# Patient Record
Sex: Male | Born: 1980
Health system: Southern US, Community
[De-identification: ages and names within clinical notes are randomized; demographics above are authoritative.]

## PROBLEM LIST (undated history)

## (undated) DIAGNOSIS — K219 Gastro-esophageal reflux disease without esophagitis: Secondary | ICD-10-CM

## (undated) DIAGNOSIS — G43909 Migraine, unspecified, not intractable, without status migrainosus: Secondary | ICD-10-CM

## (undated) HISTORY — DX: Gastro-esophageal reflux disease without esophagitis: K21.9

## (undated) HISTORY — PX: OTHER SURGICAL HISTORY: SHX169

## (undated) HISTORY — DX: Migraine, unspecified, not intractable, without status migrainosus: G43.909

---

## 2003-12-13 ENCOUNTER — Emergency Department (HOSPITAL_COMMUNITY): Admission: EM | Admit: 2003-12-13 | Discharge: 2003-12-13 | Payer: Self-pay | Admitting: Emergency Medicine

## 2003-12-15 ENCOUNTER — Ambulatory Visit (HOSPITAL_COMMUNITY): Admission: RE | Admit: 2003-12-15 | Discharge: 2003-12-15 | Payer: Self-pay | Admitting: Internal Medicine

## 2003-12-18 ENCOUNTER — Inpatient Hospital Stay (HOSPITAL_COMMUNITY): Admission: EM | Admit: 2003-12-18 | Discharge: 2003-12-20 | Payer: Self-pay | Admitting: Emergency Medicine

## 2005-09-27 IMAGING — CR DG CHEST 2V
2 series · 2 of 2 positions shown · non-contrast
Comparison: none

CLINICAL DATA: Headache, fever, vomiting.
 TWO-VIEW CHEST   RADIOGRAPH, 12/18/03
 No prior studies.

[view not recorded (1 of 2)]
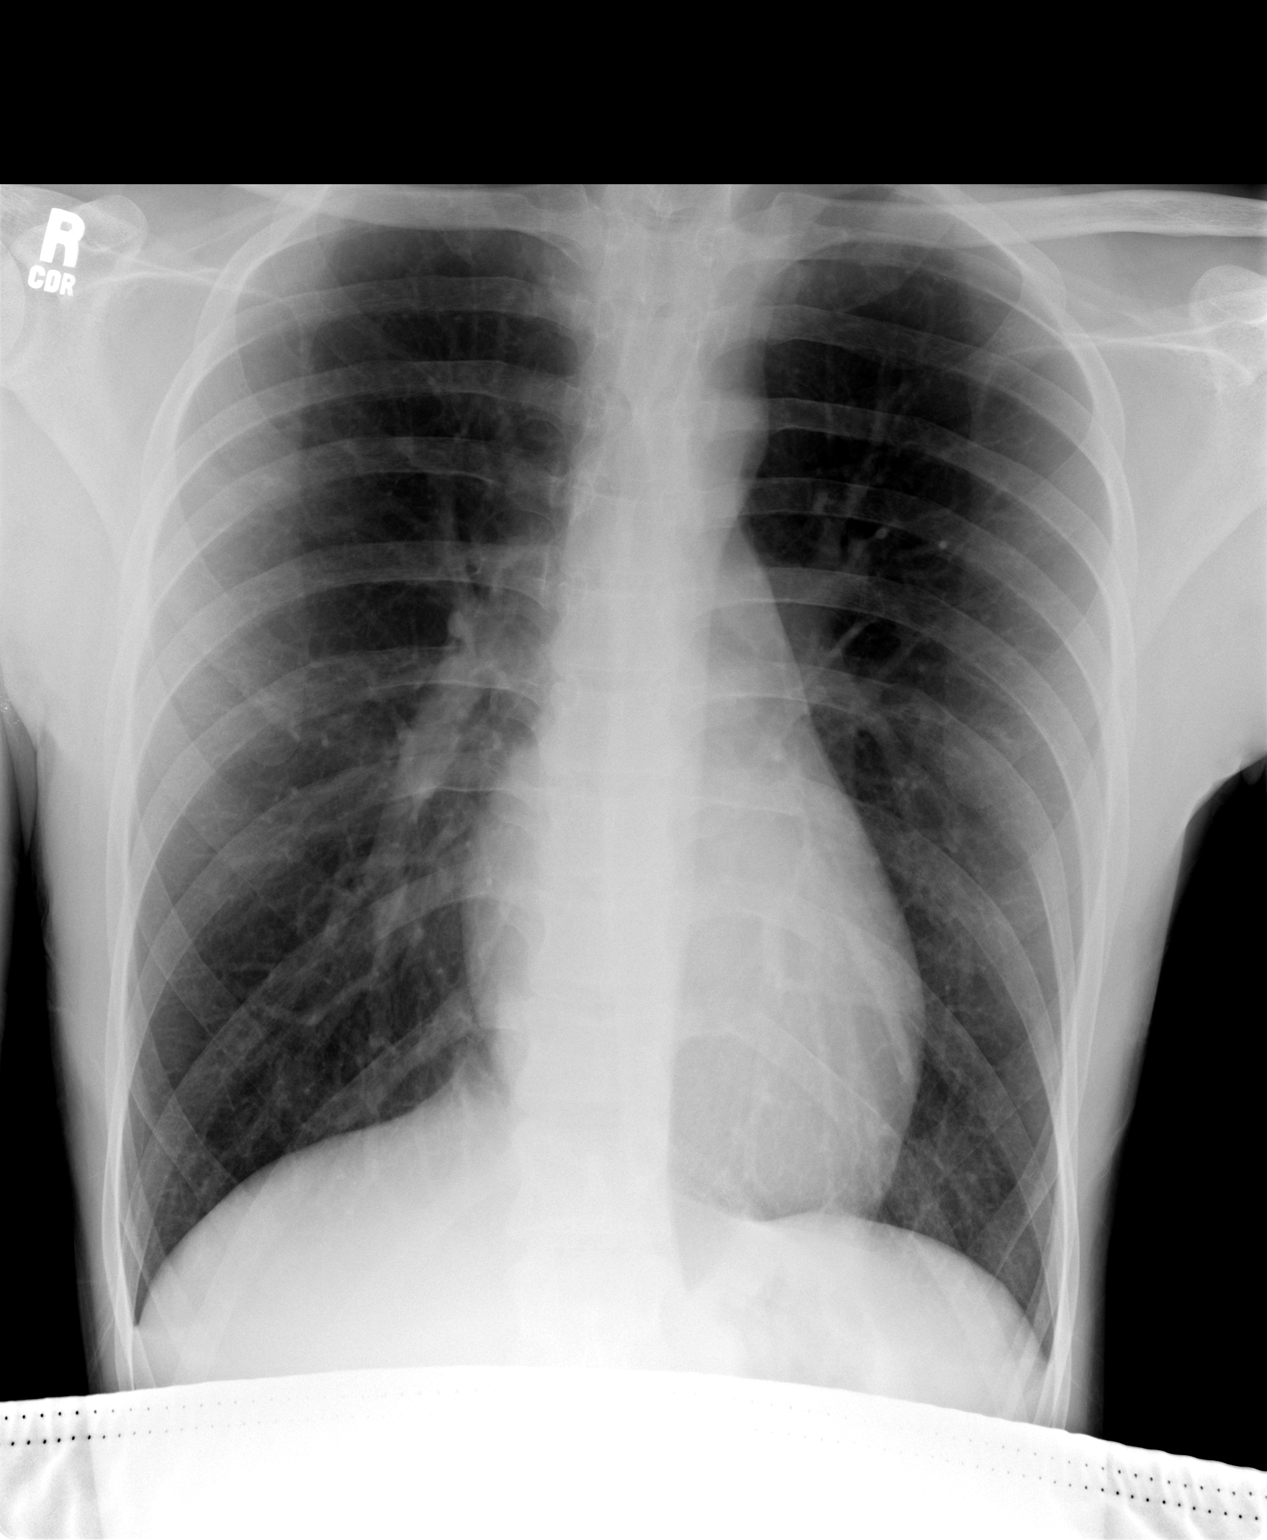

[view not recorded (2 of 2)]
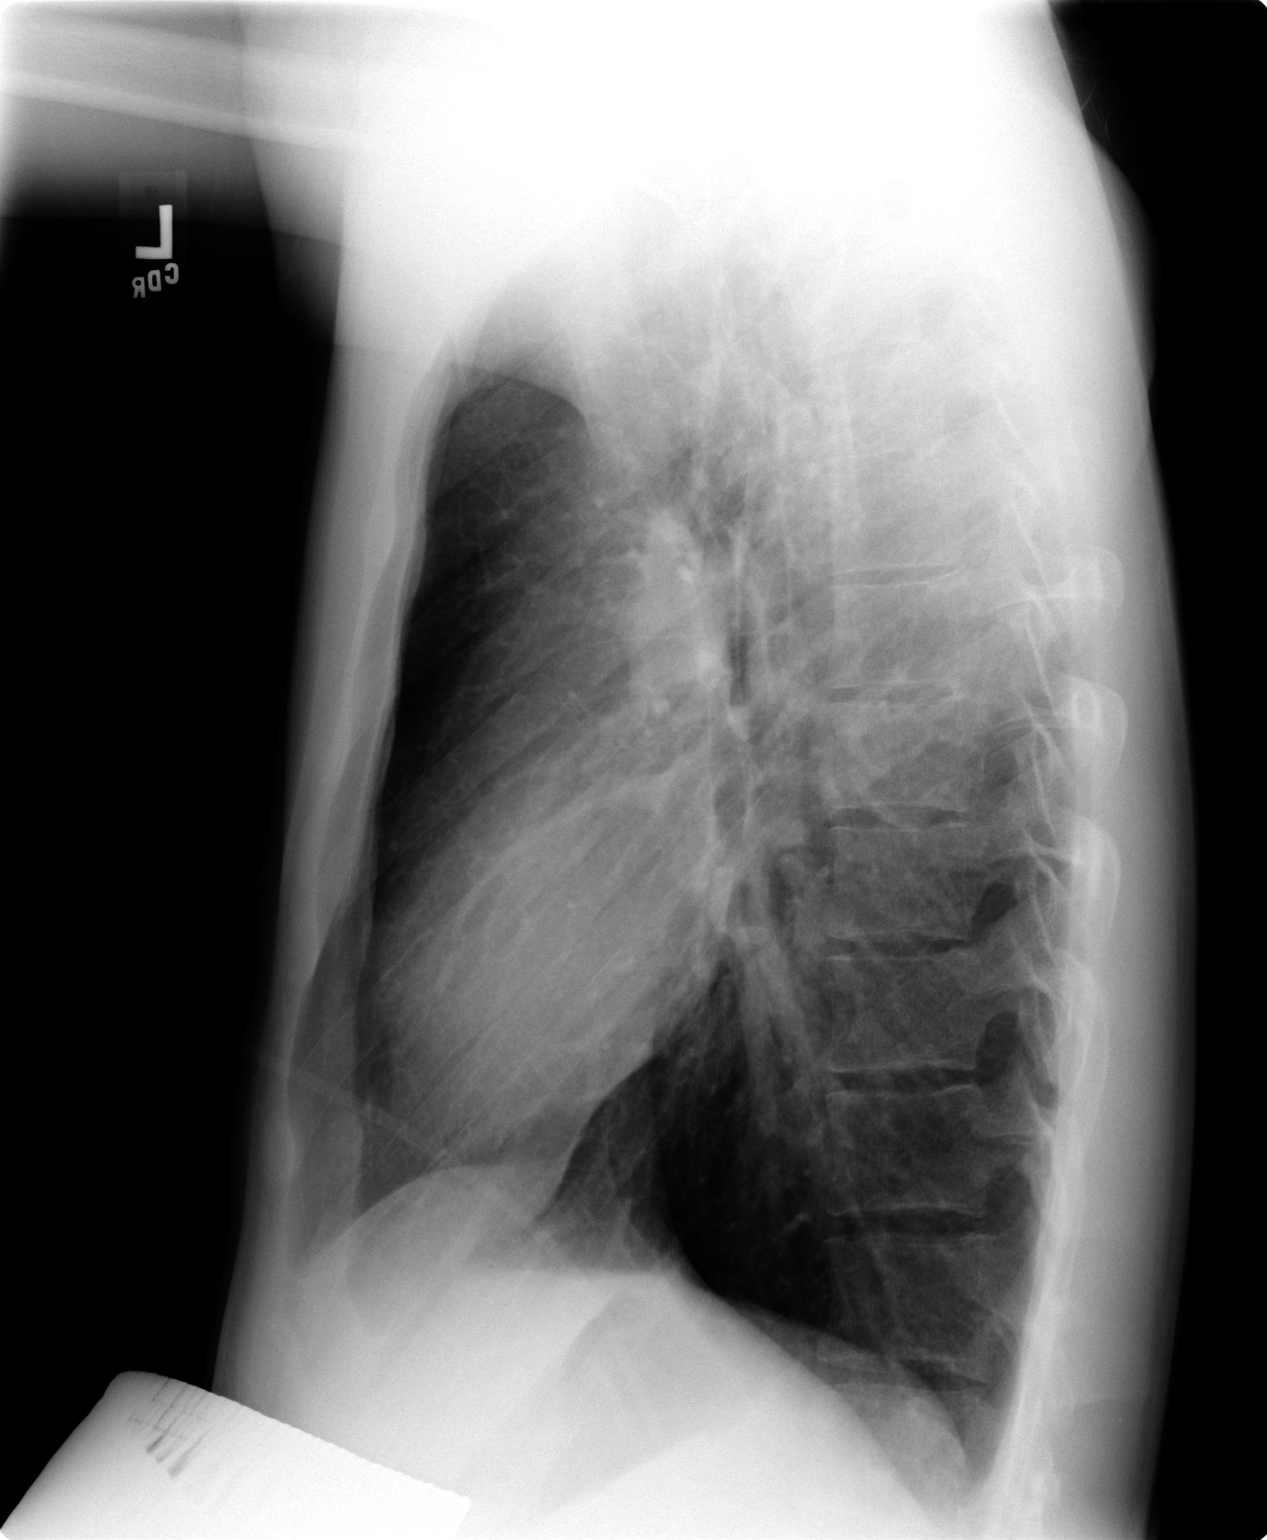

[2 of 2 positions shown; findings below may reference images not displayed]

FINDINGS: The heart size and mediastinal contours are unremarkable.  The lungs are clear.  The visualized skeleton is unremarkable.
 IMPRESSION
 No active disease.

## 2018-08-28 MED FILL — SUMATRIPTAN SUCC 100 MG TAB: 100 | 30 days supply | Qty: 9 | Fill #0

## 2018-08-29 ENCOUNTER — Other Ambulatory Visit: Payer: Self-pay | Admitting: Internal Medicine

## 2018-08-29 DIAGNOSIS — G4489 Other headache syndrome: Secondary | ICD-10-CM

## 2018-10-19 ENCOUNTER — Ambulatory Visit
Admission: RE | Admit: 2018-10-19 | Discharge: 2018-10-19 | Disposition: A | Discharge status: Home / Self Care | Payer: No Typology Code available for payment source | Source: Ambulatory Visit | Attending: Internal Medicine | Admitting: Internal Medicine

## 2018-10-19 DIAGNOSIS — G4489 Other headache syndrome: Secondary | ICD-10-CM

## 2018-10-19 MED ORDER — GADOBENATE DIMEGLUMINE 529 MG/ML IV SOLN
15.0000 mL | Freq: Once | INTRAVENOUS | Status: AC | PRN
  Administered 2018-10-19: 15 mL via INTRAVENOUS

## 2019-02-28 ENCOUNTER — Telehealth: Payer: No Typology Code available for payment source | Admitting: Physician Assistant

## 2019-02-28 DIAGNOSIS — B9689 Other specified bacterial agents as the cause of diseases classified elsewhere: Secondary | ICD-10-CM

## 2019-02-28 DIAGNOSIS — J019 Acute sinusitis, unspecified: Secondary | ICD-10-CM

## 2019-02-28 MED ORDER — AMOXICILLIN-POT CLAVULANATE 875-125 MG PO TABS: 1.0000 | ORAL_TABLET | Freq: Two times a day (BID) | ORAL | 0 refills | Status: DC

## 2019-02-28 MED FILL — AMOX-CLAV 875-125 MG TABLET: 875-125 | 7 days supply | Qty: 14 | Fill #0

## 2019-02-28 NOTE — Progress Notes (Signed)

## 2019-02-28 NOTE — Progress Notes (Signed)
I have spent 5 minutes in review of e-visit questionnaire, review and updating patient chart, medical decision making and response to patient.   Quincie Haroon Cody Malijah Lietz, PA-C    

## 2019-07-12 ENCOUNTER — Ambulatory Visit: Payer: No Typology Code available for payment source | Attending: Internal Medicine

## 2019-07-12 DIAGNOSIS — Z23 Encounter for immunization: Secondary | ICD-10-CM

## 2019-07-12 NOTE — Progress Notes (Signed)
   Covid-19 Vaccination Clinic  Name:  Nicholas Carey    MRN: [id number] DOB: 1980/07/29  07/12/2019  [name] was observed post Covid-19 immunization for 15 minutes without incident. He was provided with Vaccine Information Sheet and instruction to access the V-Safe system.   [name] was instructed to call 911 with any severe reactions post vaccine: Marland Kitchen Difficulty breathing  . Swelling of face and throat  . A fast heartbeat  . A bad rash all over body  . Dizziness and weakness   Immunizations Administered    Name Date Dose VIS Date Route   Pfizer COVID-19 Vaccine 07/12/2019  1:56 PM 0.3 mL 04/12/2019 Intramuscular   Manufacturer: [employer], [employer]   Lot: [id number]   NDC: [address]-2

## 2019-08-05 ENCOUNTER — Ambulatory Visit: Payer: No Typology Code available for payment source | Attending: Internal Medicine

## 2019-08-05 DIAGNOSIS — Z23 Encounter for immunization: Secondary | ICD-10-CM

## 2019-08-05 NOTE — Progress Notes (Signed)
   Covid-19 Vaccination Clinic  Name:  Nicholas Carey    MRN: [id number] DOB: 24-Mar-1981  08/05/2019  [name] was observed post Covid-19 immunization for 15 minutes without incident. He was provided with Vaccine Information Sheet and instruction to access the V-Safe system.   [name] was instructed to call 911 with any severe reactions post vaccine: Marland Kitchen Difficulty breathing  . Swelling of face and throat  . A fast heartbeat  . A bad rash all over body  . Dizziness and weakness   Immunizations Administered    Name Date Dose VIS Date Route   Pfizer COVID-19 Vaccine 08/05/2019  3:39 PM 0.3 mL 04/12/2019 Intramuscular   Manufacturer: [employer], [employer]   Lot: [id number]   NDC: [address]-4

## 2020-04-02 ENCOUNTER — Telehealth: Payer: No Typology Code available for payment source | Admitting: Family

## 2020-04-02 ENCOUNTER — Other Ambulatory Visit: Payer: Self-pay | Admitting: Family

## 2020-04-02 DIAGNOSIS — B9689 Other specified bacterial agents as the cause of diseases classified elsewhere: Secondary | ICD-10-CM | POA: Diagnosis not present

## 2020-04-02 DIAGNOSIS — J019 Acute sinusitis, unspecified: Secondary | ICD-10-CM

## 2020-04-02 MED ORDER — AMOXICILLIN-POT CLAVULANATE 875-125 MG PO TABS: 1.0000 | ORAL_TABLET | Freq: Two times a day (BID) | ORAL | 0 refills | Status: DC

## 2020-04-02 MED FILL — AMOX-CLAV 875-125 MG TABLET: 875-125 | 10 days supply | Qty: 20 | Fill #0

## 2020-04-02 NOTE — Progress Notes (Signed)
We are sorry that you are not feeling well.  Here is how we plan to help!  Based on what you have shared with me it looks like you have sinusitis.  Sinusitis is inflammation and infection in the sinus cavities of the head.  Based on your presentation I believe you most likely have Acute Bacterial Sinusitis.  This is an infection caused by bacteria and is treated with antibiotics. I have prescribed Augmentin 875mg/125mg one tablet twice daily with food, for 10 days. You may use an oral decongestant such as Mucinex D or if you have glaucoma or high blood pressure use plain Mucinex. Saline nasal spray help and can safely be used as often as needed for congestion.  If you develop worsening sinus pain, fever or notice severe headache and vision changes, or if symptoms are not better after completion of antibiotic, please schedule an appointment with a health care provider.    Sinus infections are not as easily transmitted as other respiratory infection, however we still recommend that you avoid close contact with loved ones, especially the very young and elderly.  Remember to wash your hands thoroughly throughout the day as this is the number one way to prevent the spread of infection!  Home Care:  Only take medications as instructed by your medical team.  Complete the entire course of an antibiotic.  Do not take these medications with alcohol.  A steam or ultrasonic humidifier can help congestion.  You can place a towel over your head and breathe in the steam from hot water coming from a faucet.  Avoid close contacts especially the very young and the elderly.  Cover your mouth when you cough or sneeze.  Always remember to wash your hands.  Get Help Right Away If:  You develop worsening fever or sinus pain.  You develop a severe head ache or visual changes.  Your symptoms persist after you have completed your treatment plan.  Make sure you  Understand these instructions.  Will watch your  condition.  Will get help right away if you are not doing well or get worse.  Your e-visit answers were reviewed by a board certified advanced clinical practitioner to complete your personal care plan.  Depending on the condition, your plan could have included both over the counter or prescription medications.  If there is a problem please reply  once you have received a response from your provider.  Your safety is important to us.  If you have drug allergies check your prescription carefully.    You can use MyChart to ask questions about today's visit, request a non-urgent call back, or ask for a work or school excuse for 24 hours related to this e-Visit. If it has been greater than 24 hours you will need to follow up with your provider, or enter a new e-Visit to address those concerns.  You will get an e-mail in the next two days asking about your experience.  I hope that your e-visit has been valuable and will speed your recovery. Thank you for using e-visits.  Greater than 5 minutes, yet less than 10 minutes of time have been spent researching, coordinating, and implementing care for this patient.     

## 2020-05-08 ENCOUNTER — Other Ambulatory Visit (HOSPITAL_COMMUNITY): Payer: Self-pay | Admitting: Internal Medicine

## 2020-05-08 MED FILL — CYCLOBENZAPRINE HCL 10 MG T: 10 | 30 days supply | Qty: 60 | Fill #0

## 2020-07-13 ENCOUNTER — Telehealth: Payer: No Typology Code available for payment source | Admitting: Physician Assistant

## 2020-07-13 ENCOUNTER — Other Ambulatory Visit: Payer: Self-pay | Admitting: Physician Assistant

## 2020-07-13 DIAGNOSIS — J028 Acute pharyngitis due to other specified organisms: Secondary | ICD-10-CM | POA: Diagnosis not present

## 2020-07-13 DIAGNOSIS — B9689 Other specified bacterial agents as the cause of diseases classified elsewhere: Secondary | ICD-10-CM

## 2020-07-13 MED ORDER — AMOXICILLIN 500 MG PO CAPS: 500.0000 mg | ORAL_CAPSULE | Freq: Two times a day (BID) | ORAL | 0 refills | Status: DC

## 2020-07-13 NOTE — Progress Notes (Signed)

## 2020-07-13 NOTE — Progress Notes (Signed)
I have spent 5 minutes in review of e-visit questionnaire, review and updating patient chart, medical decision making and response to patient.   Katalyn Matin Cody Zyeir Dymek, PA-C    

## 2020-07-29 IMAGING — MR MRI HEAD WITHOUT AND WITH CONTRAST
12 series · 48 of 48 positions shown · IV contrast (multihance)
Comparison: MRI head 12/18/2003

CLINICAL DATA: Headache 2 years.

EXAM:
MRI HEAD WITHOUT AND WITH CONTRAST
TECHNIQUE: Multiplanar, multiecho pulse sequences of the brain and surrounding
structures were obtained without and with intravenous contrast.
CONTRAST:  15mL MULTIHANCE GADOBENATE DIMEGLUMINE 529 MG/ML IV SOLN

[Series 5: T1 · sagittal · 4.0mm · 0.75mm/px · 3 of 30 slices shown (1 of 3)]
[im 1/30]
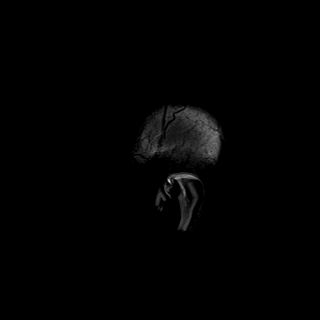
[im 15/30]
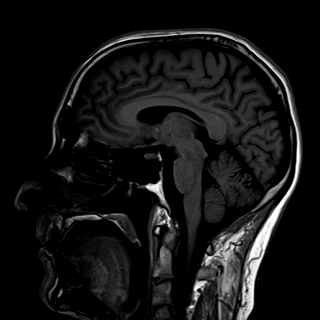
[im 30/30]
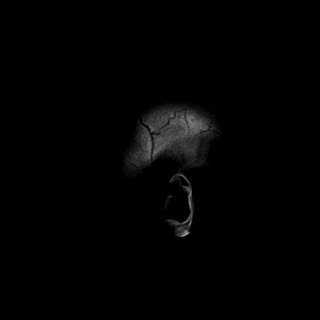

[Series 6: DWI · axial · 3.0mm · 1.44mm/px · z∈[-67,+77]mm · 5 of 86 slices shown (1 of 4)]
[im 1/86]
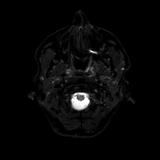
[im 22/86]
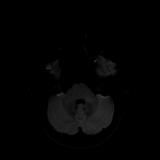
[im 43/86]
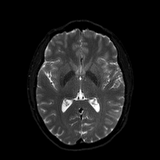
[im 64/86]
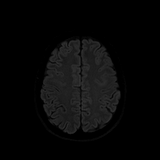
[im 86/86]
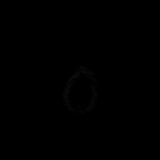

[Series 7: DWI · axial · 3.0mm · 1.44mm/px · z∈[-67,+77]mm · 2 of 42 slices shown (2 of 4)]
[im 1/42]
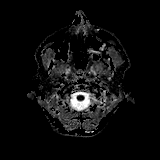
[im 42/42]
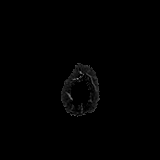

[Series 8: DWI · coronal · 5.0mm · 1.44mm/px · 4 of 64 slices shown (3 of 4)]
[im 1/64]
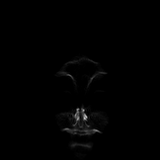
[im 22/64]
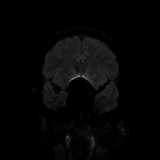
[im 43/64]
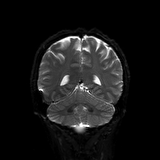
[im 64/64]
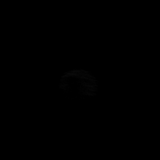

[Series 9: DWI · coronal · 5.0mm · 1.44mm/px · 2 of 32 slices shown (4 of 4)]
[im 1/32]
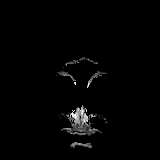
[im 32/32]
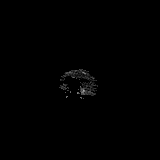

[Series 10: T2 · axial · 4.0mm · 0.36mm/px · z∈[-71,+85]mm · 2 of 31 slices shown]
[im 1/31]
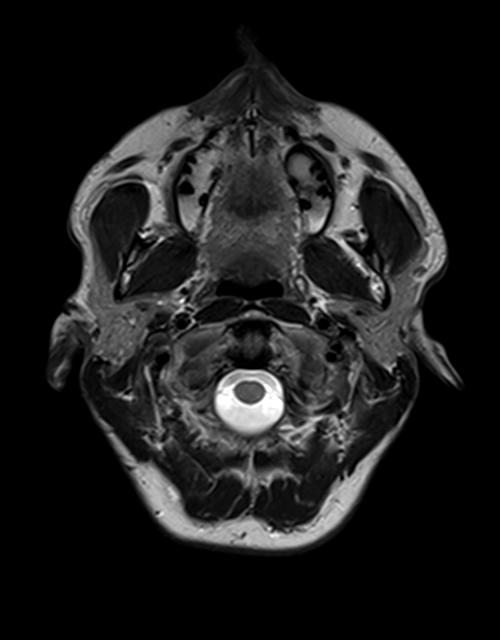
[im 31/31]
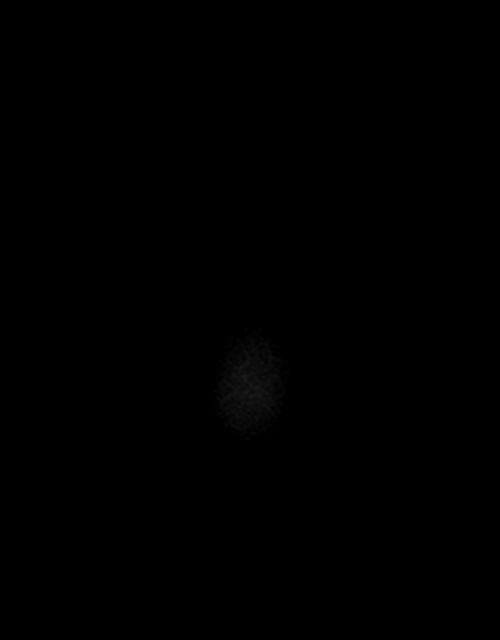

[Series 11: FLAIR · axial · 3.0mm · 0.72mm/px · z∈[-76,+86]mm · 2 of 28 slices shown]
[im 1/28]
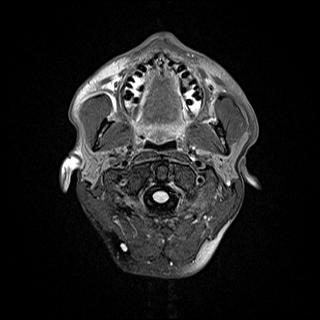
[im 28/28]
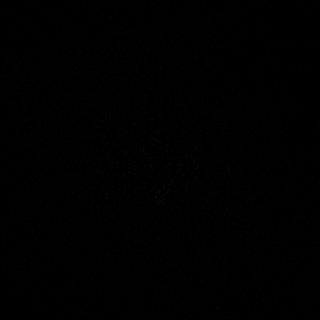

[Series 13: swi_images · axial · 1.5mm · 0.90mm/px · z∈[-70,+84]mm · 6 of 104 slices shown]
[im 1/104]
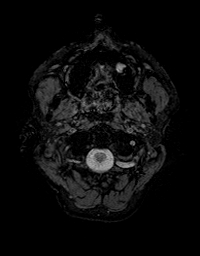
[im 21/104]
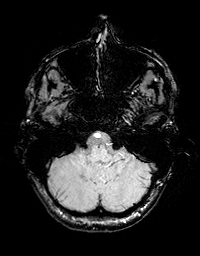
[im 42/104]
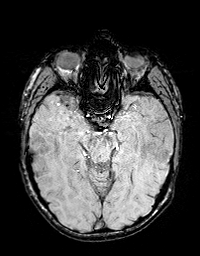
[im 62/104]
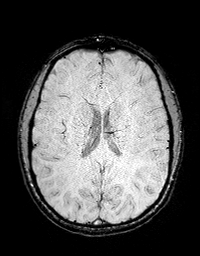
[im 83/104]
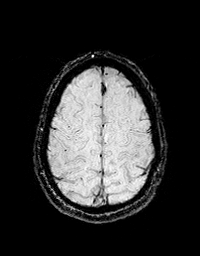
[im 104/104]
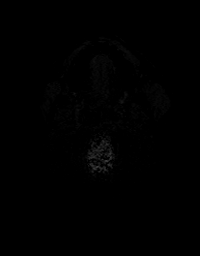

[Series 14: T1 · axial · 1.0mm · 0.90mm/px · z∈[-72,+87]mm · 9 of 160 slices shown (2 of 3)]
[im 1/160]
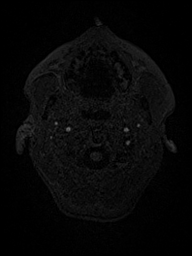
[im 20/160]
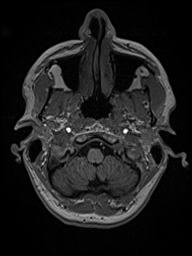
[im 40/160]
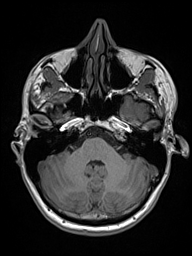
[im 60/160]
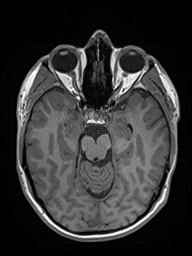
[im 80/160]
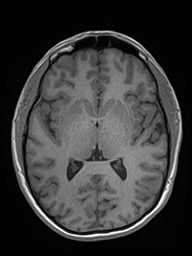
[im 100/160]
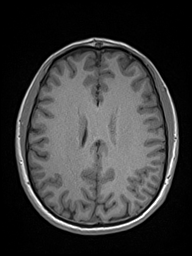
[im 120/160]
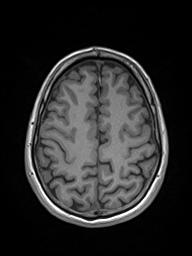
[im 140/160]
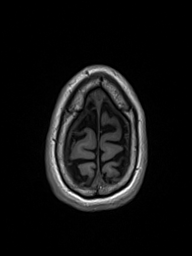
[im 160/160]
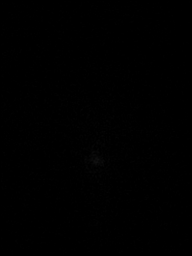

[Series 15: T2 post-contrast · coronal · 4.0mm · 0.36mm/px · 2 of 36 slices shown]
[im 1/36]
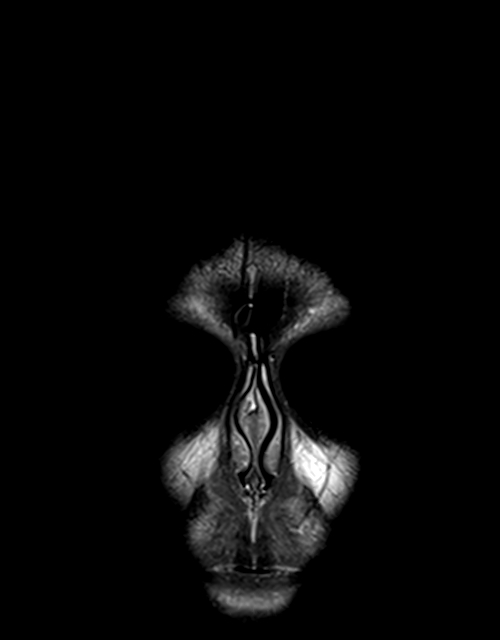
[im 36/36]
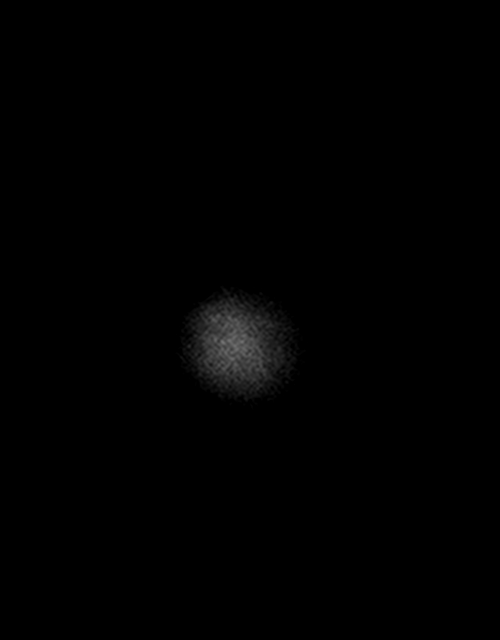

[Series 16: T1 · axial · 1.0mm · 0.90mm/px · z∈[-72,+87]mm · 9 of 160 slices shown (3 of 3)]
[im 1/160]
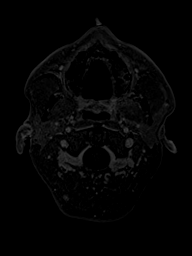
[im 20/160]
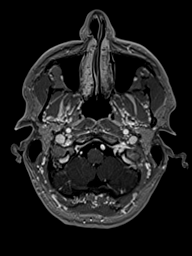
[im 40/160]
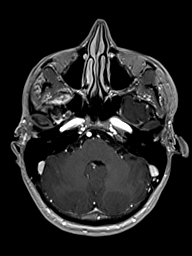
[im 60/160]
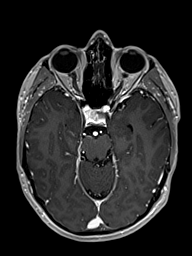
[im 80/160]
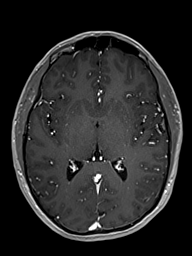
[im 100/160]
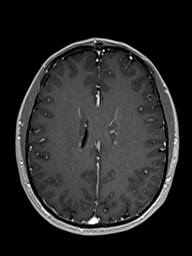
[im 120/160]
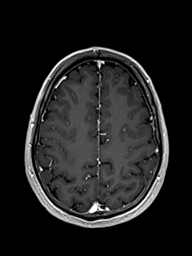
[im 140/160]
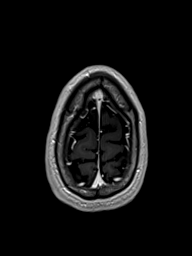
[im 160/160]
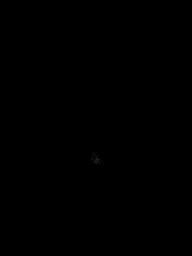

[Series 17: T1 post-contrast · coronal · 4.0mm · 0.72mm/px · 2 of 36 slices shown]
[im 1/36]
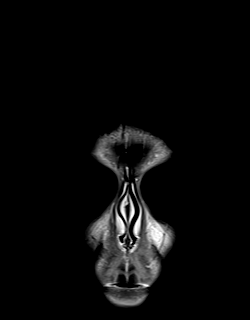
[im 36/36]
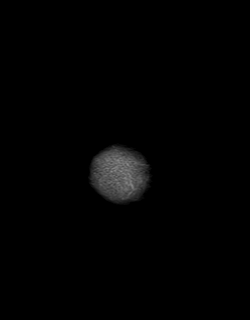

[48 of 48 positions shown; findings below may reference images not displayed]

FINDINGS: Brain: Ventricle size and cerebral volume normal. Negative for acute
or chronic infarct. Negative for demyelinating disease. Negative for
hemorrhage mass or fluid collection. Normal enhancement postcontrast
administration.

Vascular: Normal arterial flow voids

Skull and upper cervical spine: Negative

Sinuses/Orbits: Cyst left maxillary sinus. Remaining sinuses clear.
Normal orbit

Other: None
IMPRESSION: Normal MRI brain with contrast.

## 2020-12-25 ENCOUNTER — Other Ambulatory Visit (HOSPITAL_COMMUNITY): Payer: Self-pay

## 2020-12-25 ENCOUNTER — Telehealth: Payer: No Typology Code available for payment source | Admitting: Physician Assistant

## 2020-12-25 DIAGNOSIS — J019 Acute sinusitis, unspecified: Secondary | ICD-10-CM

## 2020-12-25 DIAGNOSIS — B9689 Other specified bacterial agents as the cause of diseases classified elsewhere: Secondary | ICD-10-CM | POA: Diagnosis not present

## 2020-12-25 MED ORDER — AMOXICILLIN-POT CLAVULANATE 875-125 MG PO TABS
1.0000 | ORAL_TABLET | Freq: Two times a day (BID) | ORAL | 0 refills | Status: DC
  Filled 2020-12-25: qty 20, 10d supply, fill #0

## 2020-12-25 NOTE — Progress Notes (Signed)

## 2021-08-23 ENCOUNTER — Telehealth: Payer: No Typology Code available for payment source | Admitting: Physician Assistant

## 2021-08-23 ENCOUNTER — Other Ambulatory Visit (HOSPITAL_COMMUNITY): Payer: Self-pay

## 2021-08-23 DIAGNOSIS — L247 Irritant contact dermatitis due to plants, except food: Secondary | ICD-10-CM

## 2021-08-23 MED ORDER — PREDNISONE 10 MG PO TABS
ORAL_TABLET | ORAL | 0 refills | Status: AC
  Filled 2021-08-23: qty 37, 14d supply, fill #0

## 2021-08-23 NOTE — Progress Notes (Signed)
E-Visit for Poison Ivy  We are sorry that you are not feeing well.  Here is how we plan to help!  Based on what you have shared with me it looks like you have had an allergic reaction to the oily resin from a group of plants.  This resin is very sticky, so it easily attaches to your skin, clothing, tools equipment, and pet's fur.    This blistering rash is often called poison ivy rash although it can come from contact with the leaves, stems and roots of poison ivy, poison oak and poison sumac.  The oily resin contains urushiol (u-ROO-she-ol) that produces a skin rash on exposed skin.  The severity of the rash depends on the amount of urushiol that gets on your skin.  A section of skin with more urushiol on it may develop a rash sooner.  The rash usually develops 12-48 hours after exposure and can last two to three weeks.  Your skin must come in direct contact with the plant's oil to be affected.  Blister fluid doesn't spread the rash.  However, if you come into contact with a piece of clothing or pet fur that has urushiol on it, the rash may spread out.  You can also transfer the oil to other parts of your body with your fingers.  Often the rash looks like a straight line because of the way the plant brushes against your skin.  Since your rash is widespread or has resulted in a large number of blisters, I have prescribed an oral corticosteroid.  Please follow these recommendations:  I have sent a prednisone dose pack to your chosen pharmacy. Be sure to follow the instructions carefully and complete the entire prescription. You may use Benadryl or Caladryl topical lotions to sooth the itch and remember cool, not hot, showers and baths can help relieve the itching!  Place cool, wet compresses on the affected area for 15-30 minutes several times a day.  You may also take oral antihistamines, such as diphenhydramine (Benadryl, others), which may also help you sleep better.  Watch your skin for any purulent  (pus) drainage or red streaking from the site.  If this occurs, contact your provider.  You may require an antibiotic for a skin infection.  Make sure that the clothes you were wearing as well as any towels or sheets that may have come in contact with the oil (urushiol) are washed in detergent and hot water.       I have developed the following plan to treat your condition I am prescribing a two week course of steroids (37 tablets of 10 mg prednisone).  Days 1-4 take 4 tablets (40 mg) daily  Days 5-8 take 3 tablets (30 mg) daily, Days 9-11 take 2 tablets (20 mg) daily, Days 12-14 take 1 tablet (10 mg) daily.    What can you do to prevent this rash?  Avoid the plants.  Learn how to identify poison ivy, poison oak and poison sumac in all seasons.  When hiking or engaging in other activities that might expose you to these plants, try to stay on cleared pathways.  If camping, make sure you pitch your tent in an area free of these plants.  Keep pets from running through wooded areas so that urushiol doesn't accidentally stick to their fur, which you may touch.  Remove or kill the plants.  In your yard, you can get rid of poison ivy by applying an herbicide or pulling it out of   the ground, including the roots, while wearing heavy gloves.  Afterward remove the gloves and thoroughly wash them and your hands.  Don't burn poison ivy or related plants because the urushiol can be carried by smoke.  Wear protective clothing.  If needed, protect your skin by wearing socks, boots, pants, long sleeves and vinyl gloves.  Wash your skin right away.  Washing off the oil with soap and water within 30 minutes of exposure may reduce your chances of getting a poison ivy rash.  Even washing after an hour or so can help reduce the severity of the rash.  If you walk through some poison ivy and then later touch your shoes, you may get some urushiol on your hands, which may then transfer to your face or body by touching or  rubbing.  If the contaminated object isn't cleaned, the urushiol on it can still cause a skin reaction years later.    Be careful not to reuse towels after you have washed your skin.  Also carefully wash clothing in detergent and hot water to remove all traces of the oil.  Handle contaminated clothing carefully so you don't transfer the urushiol to yourself, furniture, rugs or appliances.  Remember that pets can carry the oil on their fur and paws.  If you think your pet may be contaminated with urushiol, put on some long rubber gloves and give your pet a bath.  Finally, be careful not to burn these plants as the smoke can contain traces of the oil.  Inhaling the smoke may result in difficulty breathing. If that occurred you should see a physician as soon as possible.  See your doctor right away if:  The reaction is severe or widespread You inhaled the smoke from burning poison ivy and are having difficulty breathing Your skin continues to swell The rash affects your eyes, mouth or genitals Blisters are oozing pus You develop a fever greater than 100 F (37.8 C) The rash doesn't get better within a few weeks.  If you scratch the poison ivy rash, bacteria under your fingernails may cause the skin to become infected.  See your doctor if pus starts oozing from the blisters.  Treatment generally includes antibiotics.  Poison ivy treatments are usually limited to self-care methods.  And the rash typically goes away on its own in two to three weeks.     If the rash is widespread or results in a large number of blisters, your doctor may prescribe an oral corticosteroid, such as prednisone.  If a bacterial infection has developed at the rash site, your doctor may give you a prescription for an oral antibiotic.  MAKE SURE YOU  Understand these instructions. Will watch your condition. Will get help right away if you are not doing well or get worse.   Thank you for choosing an e-visit.  Your  e-visit answers were reviewed by a board certified advanced clinical practitioner to complete your personal care plan. Depending upon the condition, your plan could have included both over the counter or prescription medications.  Please review your pharmacy choice. Make sure the pharmacy is open so you can pick up prescription now. If there is a problem, you may contact your provider through MyChart messaging and have the prescription routed to another pharmacy.  Your safety is important to us. If you have drug allergies check your prescription carefully.   For the next 24 hours you can use MyChart to ask questions about today's visit, request a non-urgent   call back, or ask for a work or school excuse. You will get an email in the next two days asking about your experience. I hope that your e-visit has been valuable and will speed your recovery.   I provided 5 minutes of non face-to-face time during this encounter for chart review and documentation.   

## 2022-02-08 ENCOUNTER — Other Ambulatory Visit (HOSPITAL_COMMUNITY): Payer: Self-pay

## 2022-02-25 ENCOUNTER — Other Ambulatory Visit (HOSPITAL_BASED_OUTPATIENT_CLINIC_OR_DEPARTMENT_OTHER): Payer: Self-pay

## 2022-02-25 MED ORDER — COMIRNATY 30 MCG/0.3ML IM SUSY
PREFILLED_SYRINGE | INTRAMUSCULAR | 0 refills | Status: AC
  Filled 2022-02-25: qty 0.3, 1d supply, fill #0

## 2022-03-04 ENCOUNTER — Other Ambulatory Visit (HOSPITAL_COMMUNITY): Payer: Self-pay

## 2022-03-04 ENCOUNTER — Telehealth: Payer: No Typology Code available for payment source | Admitting: Emergency Medicine

## 2022-03-04 DIAGNOSIS — B9689 Other specified bacterial agents as the cause of diseases classified elsewhere: Secondary | ICD-10-CM | POA: Diagnosis not present

## 2022-03-04 DIAGNOSIS — J019 Acute sinusitis, unspecified: Secondary | ICD-10-CM

## 2022-03-04 MED ORDER — AMOXICILLIN-POT CLAVULANATE 875-125 MG PO TABS
1.0000 | ORAL_TABLET | Freq: Two times a day (BID) | ORAL | 0 refills | Status: DC
  Filled 2022-03-04: qty 14, 7d supply, fill #0

## 2022-03-04 NOTE — Progress Notes (Signed)
E-Visit for Sinus Problems  We are sorry that you are not feeling well.  Here is how we plan to help!  Based on what you have shared with me it looks like you have sinusitis.  Sinusitis is inflammation and infection in the sinus cavities of the head.  Based on your presentation I believe you most likely have Acute Bacterial Sinusitis.  This is an infection caused by bacteria and is treated with antibiotics. I have prescribed Augmentin 875mg/125mg one tablet twice daily with food, for 7 days.   You may use an oral decongestant such as Mucinex D or if you have glaucoma or high blood pressure use plain Mucinex.   Saline nasal spray help and can safely be used as often as needed for congestion.  Try using saline irrigation, such as with a neti pot, several times a day while you are sick. Many neti pots come with salt packets premeasured to use to make saline. If you use your own salt, make sure it is kosher salt or sea salt (don't use table salt as it has iodine in it and you don't need that in your nose). Use distilled water to make saline. If you mix your own saline using your own salt, the recipe is 1/4 teaspoon salt in 1 cup warm water. Using saline irrigation can help prevent and treat sinus infections.   If you develop worsening sinus pain, fever or notice severe headache and vision changes, or if symptoms are not better after completion of antibiotic, please schedule an appointment with a health care provider.    Sinus infections are not as easily transmitted as other respiratory infection, however we still recommend that you avoid close contact with loved ones, especially the very young and elderly.  Remember to wash your hands thoroughly throughout the day as this is the number one way to prevent the spread of infection!  Home Care: Only take medications as instructed by your medical team. Complete the entire course of an antibiotic. Do not take these medications with alcohol. A steam or  ultrasonic humidifier can help congestion.  You can place a towel over your head and breathe in the steam from hot water coming from a faucet. Avoid close contacts especially the very young and the elderly. Cover your mouth when you cough or sneeze. Always remember to wash your hands.  Get Help Right Away If: You develop worsening fever or sinus pain. You develop a severe head ache or visual changes. Your symptoms persist after you have completed your treatment plan.  Make sure you Understand these instructions. Will watch your condition. Will get help right away if you are not doing well or get worse.  Thank you for choosing an e-visit.  Your e-visit answers were reviewed by a board certified advanced clinical practitioner to complete your personal care plan. Depending upon the condition, your plan could have included both over the counter or prescription medications.  Please review your pharmacy choice. Make sure the pharmacy is open so you can pick up prescription now. If there is a problem, you may contact your provider through MyChart messaging and have the prescription routed to another pharmacy.  Your safety is important to us. If you have drug allergies check your prescription carefully.   For the next 24 hours you can use MyChart to ask questions about today's visit, request a non-urgent call back, or ask for a work or school excuse. You will get an email in the next two days asking   about your experience. I hope that your e-visit has been valuable and will speed your recovery.  I have spent 5 minutes in review of e-visit questionnaire, review and updating patient chart, medical decision making and response to patient.   Gerturde Kuba, PhD, FNP-BC   

## 2022-10-19 DIAGNOSIS — K219 Gastro-esophageal reflux disease without esophagitis: Secondary | ICD-10-CM | POA: Diagnosis not present

## 2022-10-19 DIAGNOSIS — Z125 Encounter for screening for malignant neoplasm of prostate: Secondary | ICD-10-CM | POA: Diagnosis not present

## 2022-10-19 DIAGNOSIS — R7301 Impaired fasting glucose: Secondary | ICD-10-CM | POA: Diagnosis not present

## 2022-10-26 DIAGNOSIS — Z0000 Encounter for general adult medical examination without abnormal findings: Secondary | ICD-10-CM | POA: Diagnosis not present

## 2022-11-22 ENCOUNTER — Other Ambulatory Visit (HOSPITAL_COMMUNITY): Payer: Self-pay

## 2022-11-22 ENCOUNTER — Telehealth: Payer: 59 | Admitting: Physician Assistant

## 2022-11-22 DIAGNOSIS — R5383 Other fatigue: Secondary | ICD-10-CM | POA: Diagnosis not present

## 2022-11-22 DIAGNOSIS — R058 Other specified cough: Secondary | ICD-10-CM | POA: Diagnosis not present

## 2022-11-22 DIAGNOSIS — R0981 Nasal congestion: Secondary | ICD-10-CM | POA: Diagnosis not present

## 2022-11-22 DIAGNOSIS — J209 Acute bronchitis, unspecified: Secondary | ICD-10-CM | POA: Diagnosis not present

## 2022-11-22 DIAGNOSIS — Z1152 Encounter for screening for COVID-19: Secondary | ICD-10-CM | POA: Diagnosis not present

## 2022-11-22 DIAGNOSIS — R0789 Other chest pain: Secondary | ICD-10-CM

## 2022-11-22 MED ORDER — AZITHROMYCIN 250 MG PO TABS
ORAL_TABLET | ORAL | 0 refills | Status: AC
  Filled 2022-11-22: qty 6, 5d supply, fill #0

## 2022-11-22 NOTE — Progress Notes (Signed)
Because of mention of shortness of breath and constant pain in chest or abdomen, I feel your condition warrants further evaluation and I recommend that you be seen in a face to face visit.   NOTE: There will be NO CHARGE for this eVisit   If you are having a true medical emergency please call 911.      For an urgent face to face visit, [facility] has eight urgent care centers for your convenience:   NEW!! [facility] at [facility] Get Driving Directions [phone] [address], [address] [place], [address]    [facility] at [facility] Get Driving Directions [phone] [address] [place], [place] [address]   [facility] Urgent Care Center [facility]) Get Driving Directions [phone] [address] [place], [place] [address]  [facility] Urgent Care Center [facility]) Get Driving Directions [phone] [address] [place],  [place]  [address]  [facility] Center [facility] - at [employer]  [phone] [address] [place],  [place] [address]   [facility] Health Urgent Care at [facility] Get Driving Directions [phone] [address] [place], [place] [address]   [facility] Health Urgent Care at [facility] [place] [facility] Get Driving Directions  [phone] [address] [place], [place] [address]   [facility] Health Urgent Care at [facility] Directions [phone] [address], [address] [place], [place] [address]  Your MyChart E-visit questionnaire answers were reviewed by a board certified advanced clinical practitioner to complete your personal care plan based on your specific symptoms.  Thank you for using e-Visits.

## 2022-12-02 ENCOUNTER — Other Ambulatory Visit (HOSPITAL_COMMUNITY): Payer: Self-pay

## 2023-02-11 DIAGNOSIS — Z23 Encounter for immunization: Secondary | ICD-10-CM | POA: Diagnosis not present

## 2023-03-03 ENCOUNTER — Other Ambulatory Visit (HOSPITAL_BASED_OUTPATIENT_CLINIC_OR_DEPARTMENT_OTHER): Payer: Self-pay

## 2023-03-03 MED ORDER — COVID-19 MRNA VAC-TRIS(PFIZER) 30 MCG/0.3ML IM SUSY
0.3000 mL | PREFILLED_SYRINGE | Freq: Once | INTRAMUSCULAR | 0 refills | Status: AC
  Filled 2023-03-03: qty 0.3, 1d supply, fill #0

## 2023-03-08 ENCOUNTER — Telehealth: Payer: 59 | Admitting: Physician Assistant

## 2023-03-08 ENCOUNTER — Other Ambulatory Visit (HOSPITAL_COMMUNITY): Payer: Self-pay

## 2023-03-08 DIAGNOSIS — M545 Low back pain, unspecified: Secondary | ICD-10-CM

## 2023-03-08 MED ORDER — NAPROXEN 500 MG PO TABS
500.0000 mg | ORAL_TABLET | Freq: Two times a day (BID) | ORAL | 0 refills | Status: AC
  Filled 2023-03-08: qty 15, 8d supply, fill #0

## 2023-03-08 MED ORDER — CYCLOBENZAPRINE HCL 10 MG PO TABS
10.0000 mg | ORAL_TABLET | Freq: Three times a day (TID) | ORAL | 0 refills | Status: AC | PRN
  Filled 2023-03-08: qty 15, 5d supply, fill #0

## 2023-03-08 NOTE — Progress Notes (Signed)
I have spent 5 minutes in review of e-visit questionnaire, review and updating patient chart, medical decision making and response to patient.   Mia Milan Cody Jacklynn Dehaas, PA-C    

## 2023-03-08 NOTE — Progress Notes (Signed)

## 2023-11-08 DIAGNOSIS — Z125 Encounter for screening for malignant neoplasm of prostate: Secondary | ICD-10-CM | POA: Diagnosis not present

## 2023-11-08 DIAGNOSIS — K219 Gastro-esophageal reflux disease without esophagitis: Secondary | ICD-10-CM | POA: Diagnosis not present

## 2023-11-08 DIAGNOSIS — R7301 Impaired fasting glucose: Secondary | ICD-10-CM | POA: Diagnosis not present

## 2023-11-16 DIAGNOSIS — Z0000 Encounter for general adult medical examination without abnormal findings: Secondary | ICD-10-CM | POA: Diagnosis not present

## 2023-11-16 DIAGNOSIS — Z1339 Encounter for screening examination for other mental health and behavioral disorders: Secondary | ICD-10-CM | POA: Diagnosis not present

## 2023-11-16 DIAGNOSIS — G43909 Migraine, unspecified, not intractable, without status migrainosus: Secondary | ICD-10-CM | POA: Diagnosis not present

## 2023-11-16 DIAGNOSIS — R82998 Other abnormal findings in urine: Secondary | ICD-10-CM | POA: Diagnosis not present

## 2023-11-16 DIAGNOSIS — K219 Gastro-esophageal reflux disease without esophagitis: Secondary | ICD-10-CM | POA: Diagnosis not present

## 2023-11-16 DIAGNOSIS — Z1331 Encounter for screening for depression: Secondary | ICD-10-CM | POA: Diagnosis not present

## 2023-11-16 DIAGNOSIS — R7301 Impaired fasting glucose: Secondary | ICD-10-CM | POA: Diagnosis not present

## 2023-11-16 DIAGNOSIS — J309 Allergic rhinitis, unspecified: Secondary | ICD-10-CM | POA: Diagnosis not present

## 2023-11-29 DIAGNOSIS — H5203 Hypermetropia, bilateral: Secondary | ICD-10-CM | POA: Diagnosis not present

## 2023-11-29 DIAGNOSIS — H52223 Regular astigmatism, bilateral: Secondary | ICD-10-CM | POA: Diagnosis not present

## 2023-11-29 DIAGNOSIS — H524 Presbyopia: Secondary | ICD-10-CM | POA: Diagnosis not present

## 2024-01-09 ENCOUNTER — Encounter: Payer: Self-pay | Admitting: Gastroenterology

## 2024-02-03 DIAGNOSIS — Z23 Encounter for immunization: Secondary | ICD-10-CM | POA: Diagnosis not present

## 2024-02-23 ENCOUNTER — Ambulatory Visit: Admitting: *Deleted

## 2024-02-23 ENCOUNTER — Other Ambulatory Visit (HOSPITAL_COMMUNITY): Payer: Self-pay

## 2024-02-23 VITALS — Ht 74.0 in | Wt 160.0 lb

## 2024-02-23 DIAGNOSIS — Z1211 Encounter for screening for malignant neoplasm of colon: Secondary | ICD-10-CM

## 2024-02-23 MED ORDER — NA SULFATE-K SULFATE-MG SULF 17.5-3.13-1.6 GM/177ML PO SOLN
1.0000 | Freq: Once | ORAL | 0 refills | Status: AC
  Filled 2024-02-23: qty 354, 1d supply, fill #0

## 2024-02-23 NOTE — Progress Notes (Signed)
 Pt's name and DOB verified at the beginning of the pre-visit with 2 identifiers  Pt denies any difficulty with ambulating,sitting, laying down or rolling side to side  Pt has no issues moving head neck or swallowing  No egg or soy allergy known to patient   No issues known to pt with past sedation  No FH of Malignant Hyperthermia  Pt is not on home 02   Pt is not on blood thinners   Pt denies issues with constipation   Pt is not on dialysis  Pt denise any abnormal heart rhythms   Pt denies any upcoming cardiac testing     Chart not reviewed by CRNA prior to PV  Visit by phone  Pt states weight is 160 lb    Pt given  both [facility] main # and MD on call # prior to instructions.  Informed pt to come in at the time discussed and is shown on PV instructions.  Pt instructed to use [employer] or GoodRx for a price reduction on prep  Instructed pt where to find PV instructions in My Ch. Copy of instructions  to be sent in mail and address read back to pt to verify correct on envelope. Instructed pt on all aspects of written instructions including med holds clothing to wear and foods to eat and not eat as well as after procedure legal restrictions and to call MD on call if needed.. Pt states understanding. Instructed pt to review instructions again prior to procedure and call main # given if has any questions or any issues. Pt states they will.

## 2024-02-28 ENCOUNTER — Encounter: Payer: Self-pay | Admitting: Gastroenterology

## 2024-03-08 ENCOUNTER — Ambulatory Visit: Admitting: Gastroenterology

## 2024-03-08 ENCOUNTER — Encounter: Payer: Self-pay | Admitting: Gastroenterology

## 2024-03-08 VITALS — BP 107/76 | HR 65 | Temp 98.1°F | Resp 13 | Ht 74.0 in | Wt 160.0 lb

## 2024-03-08 DIAGNOSIS — Z1211 Encounter for screening for malignant neoplasm of colon: Secondary | ICD-10-CM

## 2024-03-08 DIAGNOSIS — D120 Benign neoplasm of cecum: Secondary | ICD-10-CM | POA: Diagnosis not present

## 2024-03-08 DIAGNOSIS — K635 Polyp of colon: Secondary | ICD-10-CM

## 2024-03-08 DIAGNOSIS — D129 Benign neoplasm of anus and anal canal: Secondary | ICD-10-CM

## 2024-03-08 DIAGNOSIS — D128 Benign neoplasm of rectum: Secondary | ICD-10-CM | POA: Diagnosis not present

## 2024-03-08 DIAGNOSIS — K621 Rectal polyp: Secondary | ICD-10-CM | POA: Diagnosis not present

## 2024-03-08 MED ORDER — SODIUM CHLORIDE 0.9 % IV SOLN: 500.0000 mL | Freq: Once | INTRAVENOUS | Status: DC

## 2024-03-08 NOTE — Op Note (Signed)
 [facility] Patient Name: Nicholas Carey Procedure Date: 03/08/2024 8:49 AM MRN: [id number] Endoscopist: Glendia E. Stacia , MD, [id number] Age: 43 Referring MD:  Date of Birth: 04-10-1981 Gender: Male Account #: [id number] Procedure:                Colonoscopy Indications:              Screening for colorectal malignant neoplasm, This                            is the patient's first colonoscopy Medicines:                Monitored Anesthesia Care Procedure:                Pre-Anesthesia Assessment:                           - Prior to the procedure, a History and Physical                            was performed, and patient medications and                            allergies were reviewed. The patient's tolerance of                            previous anesthesia was also reviewed. The risks                            and benefits of the procedure and the sedation                            options and risks were discussed with the patient.                            All questions were answered, and informed consent                            was obtained. Prior Anticoagulants: The patient has                            taken no anticoagulant or antiplatelet agents. ASA                            Grade Assessment: I - A normal, healthy patient.                            After reviewing the risks and benefits, the patient                            was deemed in satisfactory condition to undergo the                            procedure.  After obtaining informed consent, the colonoscope                            was passed under direct vision. Throughout the                            procedure, the patient's blood pressure, pulse, and                            oxygen saturations were monitored continuously. The                            CF [id number] #[id number] was introduced through the anus                            and advanced to the the  terminal ileum, with                            identification of the appendiceal orifice and IC                            valve. The colonoscopy was performed without                            difficulty. The patient tolerated the procedure                            well. The quality of the bowel preparation was                            excellent. The terminal ileum, ileocecal valve,                            appendiceal orifice, and rectum were photographed.                            The bowel preparation used was SUPREP via split                            dose instruction. Scope In: 9:00:33 AM Scope Out: 9:14:22 AM Scope Withdrawal Time: 0 hours 10 minutes 46 seconds  Total Procedure Duration: 0 hours 13 minutes 49 seconds  Findings:                 The perianal and digital rectal examinations were                            normal. Pertinent negatives include normal                            sphincter tone and no palpable rectal lesions.                           A 5 mm polyp was found in the cecum. The polyp was  sessile. The polyp was removed with a cold snare.                            Resection and retrieval were complete. Estimated                            blood loss was minimal.                           A 4 mm polyp was found in the rectum. The polyp was                            flat. The polyp was removed with a cold snare.                            Resection and retrieval were complete. Estimated                            blood loss was minimal.                           The exam was otherwise normal throughout the                            examined colon.                           The terminal ileum appeared normal.                           The retroflexed view of the distal rectum and anal                            verge was normal and showed no anal or rectal                            abnormalities. Complications:             No immediate complications. Estimated Blood Loss:     Estimated blood loss was minimal. Impression:               - One 5 mm polyp in the cecum, removed with a cold                            snare. Resected and retrieved.                           - One 4 mm polyp in the rectum, removed with a cold                            snare. Resected and retrieved.                           - The examined portion of the ileum was normal.                           -  The distal rectum and anal verge are normal on                            retroflexion view. Recommendation:           - Patient has a contact number available for                            emergencies. The signs and symptoms of potential                            delayed complications were discussed with the                            patient. Return to normal activities tomorrow.                            Written discharge instructions were provided to the                            patient.                           - Resume previous diet.                           - Continue present medications.                           - Await pathology results.                           - Repeat colonoscopy (date not yet determined) for                            surveillance based on pathology results. Yoshimi Sarr E. Stacia, MD 03/08/2024 9:20:35 AM This report has been signed electronically.

## 2024-03-08 NOTE — Progress Notes (Signed)
 Called to room to assist during endoscopic procedure.  Patient ID and intended procedure confirmed with present staff. Received instructions for my participation in the procedure from the performing physician.

## 2024-03-08 NOTE — Progress Notes (Signed)
 [facility] History and Physical   Primary Care Physician:  Loreli Elsie JONETTA Mickey., MD   Reason for Procedure:   Colon cancer screening  Plan:    Screening coloscopy     HPI: Nicholas Carey is a 43 y.o. male undergoing initial average risk screening colonoscopy.  He has no family history of colon cancer and no chronic GI symptoms.    Past Medical History:  Diagnosis Date   GERD (gastroesophageal reflux disease)    Migraines     Past Surgical History:  Procedure Laterality Date   wisdom teeth      Prior to Admission medications   Medication Sig Start Date End Date Taking? Authorizing Provider  Ascorbic Acid (VITAMIN C WITH ROSE HIPS) 1000 MG tablet Take 1,000 mg by mouth daily.   Yes [provider]  cetirizine (ZYRTEC) 10 MG tablet daily at 12 noon. 10/01/21  Yes [provider]  amoxicillin -clavulanate (AUGMENTIN ) 875-125 MG tablet Take 1 tablet by mouth 2 (two) times daily. 03/04/22   Richad Jon HERO, NP  COVID-19 mRNA vaccine [phone] (COMIRNATY ) syringe Inject into the muscle. 02/25/22   Luiz Channel, MD  cyclobenzaprine  (FLEXERIL ) 10 MG tablet Take 1 tablet (10 mg total) by mouth 3 (three) times daily as needed. Patient not taking: Reported on 02/23/2024 03/08/23   Gladis Elsie BROCKS, PA-C  Multiple Vitamin (MULTI VITAMIN DAILY PO) daily at 12 noon. 11/24/09   [provider]  naproxen  (NAPROSYN ) 500 MG tablet Take 1 tablet (500 mg total) by mouth 2 (two) times daily with a meal. Patient not taking: Reported on 02/23/2024 03/08/23   Gladis Elsie BROCKS, PA-C  vitamin E 1000 UNIT capsule Take 1,000 Units by mouth daily. Patient not taking: Reported on 03/08/2024    [provider]    Current Outpatient Medications  Medication Sig Dispense Refill   Ascorbic Acid (VITAMIN C WITH ROSE HIPS) 1000 MG tablet Take 1,000 mg by mouth daily.     cetirizine (ZYRTEC) 10 MG tablet daily at 12 noon.     amoxicillin -clavulanate (AUGMENTIN )  875-125 MG tablet Take 1 tablet by mouth 2 (two) times daily. 14 tablet 0   COVID-19 mRNA vaccine 2023-2024 (COMIRNATY ) syringe Inject into the muscle. 0.3 mL 0   cyclobenzaprine  (FLEXERIL ) 10 MG tablet Take 1 tablet (10 mg total) by mouth 3 (three) times daily as needed. (Patient not taking: Reported on 02/23/2024) 15 tablet 0   Multiple Vitamin (MULTI VITAMIN DAILY PO) daily at 12 noon.     naproxen  (NAPROSYN ) 500 MG tablet Take 1 tablet (500 mg total) by mouth 2 (two) times daily with a meal. (Patient not taking: Reported on 02/23/2024) 15 tablet 0   vitamin E 1000 UNIT capsule Take 1,000 Units by mouth daily. (Patient not taking: Reported on 03/08/2024)     Current Facility-Administered Medications  Medication Dose Route Frequency Provider Last Rate Last Admin   0.9 %  sodium chloride infusion  500 mL Intravenous Once Stacia Glendia BRAVO, MD        Allergies as of 03/08/2024   (No Known Allergies)    Family History  Problem Relation Age of Onset   Colon cancer Neg Hx    Colon polyps Neg Hx    Esophageal cancer Neg Hx    Rectal cancer Neg Hx    Stomach cancer Neg Hx     Social History   Socioeconomic History   Marital status: Single    Spouse name: Not on file   Number  of children: Not on file   Years of education: Not on file   Highest education level: Not on file  Occupational History   Not on file  Tobacco Use   Smoking status: Never   Smokeless tobacco: Never  Vaping Use   Vaping status: Never Used  Substance and Sexual Activity   Alcohol  use: Yes    Comment: socially   Drug use: Never   Sexual activity: Yes  Other Topics Concern   Not on file  Social History Narrative   Not on file   Social Drivers of Health   Financial Resource Strain: Not on file  Food Insecurity: Not on file  Transportation Needs: Not on file  Physical Activity: Not on file  Stress: Not on file  Social Connections: Not on file  Intimate Partner Violence: Not on file    Review of  Systems:  All other review of systems negative except as mentioned in the HPI.  Physical Exam: Vital signs BP (!) 142/80   Pulse 75   Temp 98.1 F (36.7 C) (Temporal)   Ht 6' 2 (1.88 m)   Wt 160 lb (72.6 kg)   SpO2 97%   BMI 20.54 kg/m   General:   Alert,  Well-developed, well-nourished, pleasant and cooperative in NAD Airway:  Mallampati 1 Lungs:  Clear throughout to auscultation.   Heart:  Regular rate and rhythm; no murmurs, clicks, rubs,  or gallops. Abdomen:  Soft, nontender and nondistended. Normal bowel sounds.   Neuro/Psych:  Normal mood and affect. A and O x 3   Zakk Borgen E. Stacia, MD [facility]

## 2024-03-08 NOTE — Progress Notes (Signed)
 Sedate, gd SR, tolerated procedure well, VSS, report to RN

## 2024-03-08 NOTE — Progress Notes (Signed)
 Pt's states no medical or surgical changes since previsit or office visit.

## 2024-03-08 NOTE — Patient Instructions (Addendum)
 -Await pathology results -Handout on polyps provided  YOU HAD AN ENDOSCOPIC PROCEDURE TODAY AT THE [facility]:   Refer to the procedure report that was given to you for any specific questions about what was found during the examination.  If the procedure report does not answer your questions, please call your gastroenterologist to clarify.  If you requested that your care partner not be given the details of your procedure findings, then the procedure report has been included in a sealed envelope for you to review at your convenience later.  YOU SHOULD EXPECT: Some feelings of bloating in the abdomen. Passage of more gas than usual.  Walking can help get rid of the air that was put into your GI tract during the procedure and reduce the bloating. If you had a lower endoscopy (such as a colonoscopy or flexible sigmoidoscopy) you may notice spotting of blood in your stool or on the toilet paper. If you underwent a bowel prep for your procedure, you may not have a normal bowel movement for a few days.  Please Note:  You might notice some irritation and congestion in your nose or some drainage.  This is from the oxygen used during your procedure.  There is no need for concern and it should clear up in a day or so.  SYMPTOMS TO REPORT IMMEDIATELY:  Following lower endoscopy (colonoscopy or flexible sigmoidoscopy):  Excessive amounts of blood in the stool  Significant tenderness or worsening of abdominal pains  Swelling of the abdomen that is new, acute  Fever of 100F or higher  For urgent or emergent issues, a gastroenterologist can be reached at any hour by calling [phone]. Do not use MyChart messaging for urgent concerns.    DIET:  We do recommend a small meal at first, but then you may proceed to your regular diet.  Drink plenty of fluids but you should avoid alcoholic beverages for 24 hours.  ACTIVITY:  You should plan to take it easy for the rest of today and you should NOT  DRIVE or use heavy machinery until tomorrow (because of the sedation medicines used during the test).    FOLLOW UP: Our staff will call the number listed on your records the next business day following your procedure.  We will call around 7:15- 8:00 am to check on you and address any questions or concerns that you may have regarding the information given to you following your procedure. If we do not reach you, we will leave a message.     If any biopsies were taken you will be contacted by phone or by letter within the next 1-3 weeks.  Please call us  at [phone] if you have not heard about the biopsies in 3 weeks.    SIGNATURES/CONFIDENTIALITY: You and/or your care partner have signed paperwork which will be entered into your electronic medical record.  These signatures attest to the fact that that the information above on your After Visit Summary has been reviewed and is understood.  Full responsibility of the confidentiality of this discharge information lies with you and/or your care-partner.   Tissue Growths in the Colon (Colon Polyps): What to Know  Colon polyps are tissue growths inside the colon, which is part of the large intestine. A polyp may be a round bump or a growth that's shaped like a mushroom. You could have one polyp or more than one. Most colon polyps are not cancer. But some colon polyps can become cancerous over time.  Finding and removing the polyps early can help prevent this. What are the causes? The exact cause of colon polyps is not known. What increases the risk? Some things may make you more likely to get colon polyps. These include: Having a family history of colorectal cancer or colon polyps. Being older than 45 years of age. Being younger than 45 years of age and having either of these: A strong family history of colorectal cancer or colon polyps. A genetic condition that puts you at higher risk of getting colon polyps. Having inflammatory bowel disease,  such as ulcerative colitis or Crohn's disease. Having certain conditions that are passed down in families, such as: Familial adenomatous polyposis (FAP). Lynch syndrome. Turcot syndrome. Peutz-Jeghers syndrome. MUTYH-associated polyposis (MAP). Certain lifestyle factors, like: Smoking cigarettes or drinking too much alcohol . Not getting enough exercise. Being overweight. Eating a diet that's high in fat and red meat and low in fiber. Having had childhood cancer that was treated with radiation of the belly. What are the signs or symptoms? Many times, there are no symptoms. If you have symptoms, they may include: Blood coming from your rectum when pooping. Blood in your poop. The blood may be bright red or very dark in color. Pain in your belly. A change in bowel habits, such as trouble pooping (constipation) or diarrhea. How is this diagnosed? Colon polyps are diagnosed with a colonoscopy. This is a procedure where a lighted, flexible scope is inserted into the opening of the butt and then passed into the colon to examine the area. Polyps are sometimes found when a colonoscopy is done as part of routine cancer screening tests. How is this treated? Treatment involves removing any polyps that are found. Most polyps can be removed during a colonoscopy. Those polyps will then be tested for cancer. More treatment may be needed depending on the results of testing. Follow these instructions at home: Eating and drinking  Eat foods that are high in fiber, such as fruits, vegetables, and whole grains. Eat foods that are high in calcium and vitamin D. Some choices include milk, cheese, yogurt, eggs, liver, fish, and broccoli. Limit foods that are high in fat, such as fried foods and desserts. Limit the amount of red meat, precooked or cured meat, or other processed meat that you eat. These include hot dogs, sausages, bacon, and loaves made of meat. Limit drinks that have sugar in  them. Lifestyle Stay at a healthy weight. Lose weight if recommended by your health care provider. Exercise every day or as told by your provider. Do not smoke, vape, or use nicotine or tobacco. Do not drink alcohol  if: Your provider tells you not to drink. You're pregnant, may be pregnant, or plan to become pregnant. If you drink alcohol : Limit how much you have to: 0-1 drink a day if you're male. 0-2 drinks a day if you're male. Know how much alcohol  is in your drink. In the [place] one drink is one 12 oz bottle of beer (355 mL), one 5 oz glass of wine (148 mL), or one 1 oz glass of hard liquor (44 mL). General instructions Take medicines only as told. Keep all follow-up visits. This includes having regular colonoscopies. Talk to your provider about when you need a colonoscopy. Contact a health care provider if: You have new or increased bleeding when you poop. You have new or more blood in your poop. You have a change in bowel habits like constipation or diarrhea. You lose weight for no known  reason. You throw up or feel like you may throw up. Get help right away if: You have very bad pain in your belly. This information is not intended to replace advice given to you by your health care provider. Make sure you discuss any questions you have with your health care provider. Document Revised: 02/15/2023 Document Reviewed: 02/15/2023 Elsevier Patient Education  2025 [employer].

## 2024-03-11 ENCOUNTER — Telehealth: Payer: Self-pay

## 2024-03-11 NOTE — Telephone Encounter (Signed)
No answer on follow up call. 

## 2024-03-12 LAB — SURGICAL PATHOLOGY

## 2024-03-16 ENCOUNTER — Ambulatory Visit: Payer: Self-pay | Admitting: Gastroenterology

## 2024-03-16 NOTE — Progress Notes (Signed)
 Nicholas Carey,  One polyp which I removed during your recent procedure was proven to be completely benign but is considered a pre-cancerous polyp that MAY have grown into cancer if it had not been removed.  The other polyp was not precancerous.  Studies shows that at least 20% of women over age 44 and 30% of men over age 79 have pre-cancerous polyps.  Based on current nationally recognized surveillance guidelines, I recommend that you have a repeat colonoscopy in 7 years.   If you develop any new rectal bleeding, abdominal pain or significant bowel habit changes, please contact me before then.
